# Patient Record
Sex: Male | Born: 1980
Health system: Southern US, Community
[De-identification: ages and names within clinical notes are randomized; demographics above are authoritative.]

---

## 2017-02-04 DIAGNOSIS — M7989 Other specified soft tissue disorders: Secondary | ICD-10-CM | POA: Diagnosis not present

## 2017-02-04 DIAGNOSIS — R000 Tachycardia, unspecified: Secondary | ICD-10-CM | POA: Diagnosis not present

## 2017-02-04 DIAGNOSIS — R509 Fever, unspecified: Secondary | ICD-10-CM | POA: Diagnosis not present

## 2017-02-04 DIAGNOSIS — E669 Obesity, unspecified: Secondary | ICD-10-CM | POA: Diagnosis not present

## 2017-02-04 DIAGNOSIS — L03116 Cellulitis of left lower limb: Secondary | ICD-10-CM | POA: Diagnosis not present

## 2018-02-23 DIAGNOSIS — S6991XA Unspecified injury of right wrist, hand and finger(s), initial encounter: Secondary | ICD-10-CM | POA: Diagnosis not present

## 2018-02-23 DIAGNOSIS — S61212A Laceration without foreign body of right middle finger without damage to nail, initial encounter: Secondary | ICD-10-CM | POA: Diagnosis not present

## 2018-02-23 DIAGNOSIS — S61206A Unspecified open wound of right little finger without damage to nail, initial encounter: Secondary | ICD-10-CM | POA: Diagnosis not present

## 2018-02-23 DIAGNOSIS — R030 Elevated blood-pressure reading, without diagnosis of hypertension: Secondary | ICD-10-CM | POA: Diagnosis not present

## 2018-03-13 ENCOUNTER — Encounter: Payer: Self-pay | Admitting: Family Medicine

## 2018-03-13 ENCOUNTER — Ambulatory Visit (INDEPENDENT_AMBULATORY_CARE_PROVIDER_SITE_OTHER): Payer: BLUE CROSS/BLUE SHIELD | Admitting: Family Medicine

## 2018-03-13 VITALS — BP 162/98 | HR 69 | Temp 98.2°F | Ht 75.0 in | Wt >= 6400 oz

## 2018-03-13 DIAGNOSIS — Z23 Encounter for immunization: Secondary | ICD-10-CM

## 2018-03-13 DIAGNOSIS — I10 Essential (primary) hypertension: Secondary | ICD-10-CM | POA: Insufficient documentation

## 2018-03-13 DIAGNOSIS — Z13220 Encounter for screening for lipoid disorders: Secondary | ICD-10-CM

## 2018-03-13 LAB — CBC
HCT: 50.4 % (ref 39.0–52.0)
Hemoglobin: 16.9 g/dL (ref 13.0–17.0)
MCHC: 33.6 g/dL (ref 30.0–36.0)
MCV: 90.1 fl (ref 78.0–100.0)
PLATELETS: 272 10*3/uL (ref 150.0–400.0)
RBC: 5.59 Mil/uL (ref 4.22–5.81)
RDW: 14.4 % (ref 11.5–15.5)
WBC: 7.4 10*3/uL (ref 4.0–10.5)

## 2018-03-13 LAB — LIPID PANEL
Cholesterol: 240 mg/dL — ABNORMAL HIGH (ref 0–200)
HDL: 48.1 mg/dL (ref >39.00)
LDL Cholesterol: 164 mg/dL — ABNORMAL HIGH (ref 0–99)
NonHDL: 191.48
Total CHOL/HDL Ratio: 5
Triglycerides: 139 mg/dL (ref 0.0–149.0)
VLDL: 27.8 mg/dL (ref 0.0–40.0)

## 2018-03-13 LAB — TSH: TSH: 2.8 u[IU]/mL (ref 0.35–4.50)

## 2018-03-13 LAB — COMPREHENSIVE METABOLIC PANEL
ALT: 59 U/L — ABNORMAL HIGH (ref 0–53)
AST: 39 U/L — ABNORMAL HIGH (ref 0–37)
Albumin: 4.4 g/dL (ref 3.5–5.2)
Alkaline Phosphatase: 87 U/L (ref 39–117)
BUN: 12 mg/dL (ref 6–23)
CO2: 25 mEq/L (ref 19–32)
Calcium: 9.6 mg/dL (ref 8.4–10.5)
Chloride: 102 mEq/L (ref 96–112)
Creatinine, Ser: 0.87 mg/dL (ref 0.40–1.50)
GFR: 105.67 mL/min (ref >60.00)
Glucose, Bld: 98 mg/dL (ref 70–99)
Potassium: 3.8 mEq/L (ref 3.5–5.1)
Sodium: 140 mEq/L (ref 135–145)
Total Bilirubin: 0.7 mg/dL (ref 0.2–1.2)
Total Protein: 7.9 g/dL (ref 6.0–8.3)

## 2018-03-13 MED ORDER — OLMESARTAN MEDOXOMIL-HCTZ 20-12.5 MG PO TABS: 1.0000 | ORAL_TABLET | Freq: Every day | ORAL | 1 refills | Status: DC

## 2018-03-13 NOTE — Progress Notes (Signed)
Bob Delacruz - 38 y.o. male MRN [id number]  Date of birth: [birth year]  Subjective Chief Complaint  Patient presents with  . Hypertension    concerned about his BP-has been elevated the past two weeks    HPI Bob Delacruz is a 38 y.o. male here today to establish care and discuss blood pressure.  He reports he had a laceration to his finger and found to have elevated blood pressure when he went to have this repaired.  On f/u to have sutures removed his BP was still elevated and they suggested he see a PCP to discuss this.  He denies any symptoms related to HTN including chest pain, shortness of breath, palpitations, headache or vision changes.   He admits that diet and activity could be better.  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    ROS:  A comprehensive ROS was completed and negative except as noted per HPI  No Known Allergies  History reviewed. No pertinent past medical history.  History reviewed. No pertinent surgical history.  Social History   Socioeconomic History  . Marital status: Single    Spouse name: Not on file  . Number of children: Not on file  . Years of education: Not on file  . Highest education level: Not on file  Occupational History  . Not on file  Social Needs  . Financial resource strain: Not on file  . Food insecurity:    Worry: Not on file    Inability: Not on file  . Transportation needs:    Medical: Not on file    Non-medical: Not on file  Tobacco Use  . Smoking status: Never Smoker  . Smokeless tobacco: Never Used  Substance and Sexual Activity  . Alcohol use: Yes  . Drug use: Never  . Sexual activity: Yes  Lifestyle  . Physical activity:    Days per week: Not on file    Minutes per session: Not on file  . Stress: Not on file  Relationships  . Social connections:    Talks on phone: Not on file    Gets together: Not on file    Attends religious service: Not on file    Active member  of club or organization: Not on file    Attends meetings of clubs or organizations: Not on file    Relationship status: Not on file  Other Topics Concern  . Not on file  Social History Narrative  . Not on file    Family History  Problem Relation Age of Onset  . Hypertension Father   . Stroke Father   . Diabetes Maternal Grandmother     Health Maintenance  Topic Date Due  . HIV Screening  06/08/1997  . TETANUS/TDAP  06/08/2001  . INFLUENZA VACCINE  05/29/2018 (Originally 09/28/2017)    ----------------------------------------------------------------------------------------------------------------------------------------------------------------------------------------------------------------- Physical Exam BP (!) 162/98   Pulse 69   Temp 98.2 F (36.8 C) (Oral)   Ht '6\' 3"'$  (1.905 m)   Wt (!) 404 lb (183.3 kg)   SpO2 98%   BMI 50.50 kg/m   Physical Exam Constitutional:      Appearance: He is obese. He is not ill-appearing.  HENT:     Head: Normocephalic and atraumatic.     Mouth/Throat:     Mouth: Mucous membranes are moist.  Eyes:     General: No scleral icterus. Neck:     Musculoskeletal: Normal range of motion.  Cardiovascular:     Rate  and Rhythm: Normal rate and regular rhythm.  Pulmonary:     Effort: Pulmonary effort is normal.     Breath sounds: Normal breath sounds.  Musculoskeletal:     Right lower leg: No edema.     Left lower leg: No edema.  Skin:    General: Skin is warm and dry.     Findings: No rash.  Neurological:     General: No focal deficit present.     Mental Status: He is alert.  Psychiatric:        Mood and Affect: Mood normal.        Behavior: Behavior normal.     ------------------------------------------------------------------------------------------------------------------------------------------------------------------------------------------------------------------- Assessment and Plan  Essential hypertension -BP elevated  here today -Start benicar-hct -Discussed low salt diet.  Labs per orders: Orders Placed This Encounter  Procedures  . Flu Vaccine QUAD 6+ mos PF IM (Fluarix Quad PF)  . Comp Met (CMET)  . CBC  . Lipid Profile  . TSH  Return in about 2 weeks (around 03/27/2018) for HTN.

## 2018-03-13 NOTE — Patient Instructions (Signed)

## 2018-03-13 NOTE — Assessment & Plan Note (Signed)
-  BP elevated here today -Start benicar-hct -Discussed low salt diet.  Labs per orders: Orders Placed This Encounter  Procedures  . Flu Vaccine QUAD 6+ mos PF IM (Fluarix Quad PF)  . Comp Met (CMET)  . CBC  . Lipid Profile  . TSH  Return in about 2 weeks (around 03/27/2018) for HTN.

## 2018-03-15 NOTE — Progress Notes (Signed)
-  Liver enzymes are a little elevated.  Recommend limiting any alcohol intake.  We'll recheck this again at his follow up visit.  -Cholesterol is elevated as well.  We'll talk more about management of this at his follow up on 1/28

## 2018-03-27 ENCOUNTER — Ambulatory Visit: Payer: BLUE CROSS/BLUE SHIELD | Admitting: Family Medicine

## 2018-03-29 ENCOUNTER — Ambulatory Visit (INDEPENDENT_AMBULATORY_CARE_PROVIDER_SITE_OTHER): Payer: BLUE CROSS/BLUE SHIELD | Admitting: Family Medicine

## 2018-03-29 ENCOUNTER — Encounter: Payer: Self-pay | Admitting: Family Medicine

## 2018-03-29 VITALS — BP 150/82 | HR 63 | Temp 98.5°F | Ht 75.0 in | Wt >= 6400 oz

## 2018-03-29 DIAGNOSIS — I10 Essential (primary) hypertension: Secondary | ICD-10-CM | POA: Diagnosis not present

## 2018-03-29 DIAGNOSIS — E785 Hyperlipidemia, unspecified: Secondary | ICD-10-CM | POA: Diagnosis not present

## 2018-03-29 DIAGNOSIS — R748 Abnormal levels of other serum enzymes: Secondary | ICD-10-CM

## 2018-03-29 LAB — HEPATIC FUNCTION PANEL
ALBUMIN: 4.3 g/dL (ref 3.5–5.2)
ALK PHOS: 74 U/L (ref 39–117)
ALT: 55 U/L — ABNORMAL HIGH (ref 0–53)
AST: 28 U/L (ref 0–37)
Bilirubin, Direct: 0.2 mg/dL (ref 0.0–0.3)
Total Bilirubin: 0.9 mg/dL (ref 0.2–1.2)
Total Protein: 7.6 g/dL (ref 6.0–8.3)

## 2018-03-29 MED ORDER — OLMESARTAN MEDOXOMIL-HCTZ 40-25 MG PO TABS: 1.0000 | ORAL_TABLET | Freq: Every day | ORAL | 1 refills | Status: AC

## 2018-03-29 MED ORDER — ATORVASTATIN CALCIUM 20 MG PO TABS: 20.0000 mg | ORAL_TABLET | Freq: Every day | ORAL | 2 refills | Status: AC

## 2018-03-29 NOTE — Assessment & Plan Note (Signed)
-  BP improved from last check but still not at goal. -Increase benicar-hct to 40/25mg  -Return to recheck in 8 weeks.

## 2018-03-29 NOTE — Assessment & Plan Note (Signed)
-  Discussed starting medication for management of cholesterol which he is willing to try.  -Start atorvastatin 20mg .  -Recommend that he continue to work on lifestyle changes.

## 2018-03-29 NOTE — Assessment & Plan Note (Signed)
Update LFTs today.  

## 2018-03-29 NOTE — Patient Instructions (Signed)
-  Increase olmesartan-HCTZ to 40mg /25mg  each day.  (you can take 2 of your current dose and I have sent in a new prescription, you will only take 1 of these) -Start atorvastatin for cholesterol -See me again in 8 weeks.

## 2018-03-29 NOTE — Progress Notes (Signed)
Bob Delacruz - 38 y.o. male MRN [id number]  Date of birth: 1980/09/24  Subjective Chief Complaint  Patient presents with  . Follow-up    denies changes in his health    HPI Bob Delacruz is a 38 y.o. male here today for follow up of HTN.  Started on benicar at recent appt.  HE reports taking daily and tolerating well.  He denies side effects including symptoms of hypotension.  He is working on making dietary changes including eliminating fried products.  He denies chest pain, shortness of breath, palpitations, headache or vision changes.    His cholesterol and LFT's were elevated on recent lab testing.  He would be interested in starting medication to help with mgmt of his cholesterol.  He denies significant EtOH use.    ROS:  A comprehensive ROS was completed and negative except as noted per HPI  No Known Allergies  No past medical history on file.  No past surgical history on file.  Social History   Socioeconomic History  . Marital status: Single    Spouse name: Not on file  . Number of children: Not on file  . Years of education: Not on file  . Highest education level: Not on file  Occupational History  . Not on file  Social Needs  . Financial resource strain: Not on file  . Food insecurity:    Worry: Not on file    Inability: Not on file  . Transportation needs:    Medical: Not on file    Non-medical: Not on file  Tobacco Use  . Smoking status: Never Smoker  . Smokeless tobacco: Never Used  Substance and Sexual Activity  . Alcohol use: Yes  . Drug use: Never  . Sexual activity: Yes  Lifestyle  . Physical activity:    Days per week: Not on file    Minutes per session: Not on file  . Stress: Not on file  Relationships  . Social connections:    Talks on phone: Not on file    Gets together: Not on file    Attends religious service: Not on file    Active member of club or organization: Not on file    Attends meetings of clubs or organizations: Not on file   Relationship status: Not on file  Other Topics Concern  . Not on file  Social History Narrative  . Not on file    Family History  Problem Relation Age of Onset  . Hypertension Father   . Stroke Father   . Diabetes Maternal Grandmother     Health Maintenance  Topic Date Due  . HIV Screening  06/08/1997  . TETANUS/TDAP  06/22/2027  . INFLUENZA VACCINE  Completed    ----------------------------------------------------------------------------------------------------------------------------------------------------------------------------------------------------------------- Physical Exam BP (!) 150/82   Pulse 63   Temp 98.5 F (36.9 C) (Oral)   Ht 6\' 3"  (1.905 m)   Wt (!) 401 lb (181.9 kg)   SpO2 99%   BMI 50.12 kg/m   Physical Exam Constitutional:      Appearance: He is obese.  HENT:     Head: Normocephalic and atraumatic.  Eyes:     General: No scleral icterus. Neck:     Musculoskeletal: Neck supple.  Cardiovascular:     Rate and Rhythm: Normal rate and regular rhythm.  Pulmonary:     Effort: Pulmonary effort is normal.     Breath sounds: Normal breath sounds.  Skin:    General: Skin is warm and dry.  Findings: No rash.  Neurological:     General: No focal deficit present.     Mental Status: He is alert and oriented to person, place, and time.  Psychiatric:        Mood and Affect: Mood normal.        Behavior: Behavior normal.     ------------------------------------------------------------------------------------------------------------------------------------------------------------------------------------------------------------------- Assessment and Plan  HLD (hyperlipidemia) -Discussed starting medication for management of cholesterol which he is willing to try.  -Start atorvastatin 20mg .  -Recommend that he continue to work on lifestyle changes.   Essential hypertension -BP improved from last check but still not at goal. -Increase benicar-hct  to 40/25mg  -Return to recheck in 8 weeks.   Elevated liver enzymes -Update LFT's today

## 2018-04-03 NOTE — Progress Notes (Signed)
Liver enzymes improved some.  We'll continue to keep an eye on this with routine lab work.

## 2018-05-24 ENCOUNTER — Encounter: Payer: Self-pay | Admitting: Family Medicine

## 2018-05-24 ENCOUNTER — Ambulatory Visit (INDEPENDENT_AMBULATORY_CARE_PROVIDER_SITE_OTHER): Payer: BLUE CROSS/BLUE SHIELD | Admitting: Family Medicine

## 2018-05-24 DIAGNOSIS — E785 Hyperlipidemia, unspecified: Secondary | ICD-10-CM

## 2018-05-24 DIAGNOSIS — I10 Essential (primary) hypertension: Secondary | ICD-10-CM

## 2018-05-24 NOTE — Progress Notes (Addendum)
Virtual Visit via Telephone Note  I connected with Bob Delacruz on 05/24/18 at  8:30 AM EDT by telephone and verified that I am speaking with the correct person using two identifiers.  Interactive audio and video telecommunications were attempted between this provider and patient, however failed, due to patient having technical difficulties OR patient did not have access to video capability.  We continued and completed visit with audio only.     I discussed the limitations, risks, security and privacy concerns of performing an evaluation and management service by telephone and the availability of in person appointments. I also discussed with the patient that there may be a patient responsible charge related to this service. The patient expressed understanding and agreed to proceed.  Location patient: home Location provider: work or home office Participants present for the call: patient, provider    History of Present Illness: Bob Delacruz is a 38 y.o. male with history of HTN and HLD following up for these chronic issues.   -HTN:  He reports that he is doing well.  BP improving, readings ranging from 135-155/78-88.  He denies side effects from medication.  He feels well.  He denies chest pain, shortness of breath, palpitations. headache or vision change.s   -HLD:  Started on atorvastatin at previous visit.  He is doing well with medication without symptoms of myalgias or GI upset.  .  ROS:  A comprehensive ROS was completed and negative except as noted per HPI    Observations/Objective: Patient sounds cheerful and well on the phone. I do not appreciate any SOB. Speech and thought processing are grossly intact. Patient reported vitals: BP (!) 154/86 Comment: Pt reported from Home [employer] machine over the phone   Assessment and Plan: Essential hypertension -BP improving, he will continue current medications -Stress following low salt diet and recommend weight loss -face to face visit  planned for 3 months.   HLD (hyperlipidemia) -tolerating statin well, he will continue.  Recheck lipids at f/u visit .     Follow Up Instructions:  He will continue current medication and f/u in 3 months for a face to face visit.    I discussed the assessment and treatment plan with the patient. The patient was provided an opportunity to ask questions and all were answered. The patient agreed with the plan and demonstrated an understanding of the instructions.   The patient was advised to call back or seek an in-person evaluation if the symptoms worsen or if the condition fails to improve as anticipated.  I provided 15  minutes of non-face-to-face time during this encounter.   Everrett Coombe, DO

## 2018-05-24 NOTE — Assessment & Plan Note (Signed)
-  tolerating statin well, he will continue.  Recheck lipids at f/u visit .

## 2018-05-24 NOTE — Assessment & Plan Note (Signed)
-  BP improving, he will continue current medications -Stress following low salt diet and recommend weight loss -face to face visit planned for 3 months.

## 2018-08-15 ENCOUNTER — Ambulatory Visit: Payer: BLUE CROSS/BLUE SHIELD | Admitting: Family Medicine

## 2018-08-17 ENCOUNTER — Telehealth: Payer: Self-pay

## 2018-08-17 NOTE — Telephone Encounter (Signed)
Questions for Screening COVID-19  Symptom onset: none Travel or Contacts: none  During this illness, did/does the patient experience any of the following symptoms? Fever >100.1F []   Yes [x]   No []   Unknown Subjective fever (felt feverish) []   Yes [x]   No []   Unknown Chills []   Yes [x]   No []   Unknown Muscle aches (myalgia) []   Yes [x]   No []   Unknown Runny nose (rhinorrhea) []   Yes [x]   No []   Unknown Sore throat []   Yes [x]   No []   Unknown Cough (new onset or worsening of chronic cough) []   Yes [x]   No []   Unknown Shortness of breath (dyspnea) []   Yes [x]   No []   Unknown Nausea or vomiting []   Yes [x]   No []   Unknown Headache []   Yes [x]   No []   Unknown Abdominal pain  []   Yes [x]   No []   Unknown Diarrhea (?3 loose/looser than normal stools/24hr period) []   Yes [x]   No []   Unknown Other, specify:  Patient risk factors: Smoker? [x]   Current []   Former []   Never If male, currently pregnant? []   Yes [x]   No  Patient Active Problem List   Diagnosis Date Noted  . Elevated liver enzymes 03/29/2018  . HLD (hyperlipidemia) 03/29/2018  . Essential hypertension 03/13/2018    Plan:  []   High risk for COVID-19 with red flags go to ED (with CP, SOB, weak/lightheaded, or fever > 101.5). Call ahead.  []   High risk for COVID-19 but stable. Inform provider and coordinate time for [facility] visit.   [x]   No red flags but URI signs or symptoms okay for [facility] visit.

## 2018-08-20 ENCOUNTER — Ambulatory Visit: Payer: BC Managed Care – PPO | Admitting: Family Medicine

## 2018-12-03 ENCOUNTER — Ambulatory Visit (INDEPENDENT_AMBULATORY_CARE_PROVIDER_SITE_OTHER): Payer: BC Managed Care – PPO

## 2018-12-03 ENCOUNTER — Other Ambulatory Visit: Payer: Self-pay | Admitting: Family Medicine

## 2018-12-03 ENCOUNTER — Ambulatory Visit (INDEPENDENT_AMBULATORY_CARE_PROVIDER_SITE_OTHER): Payer: BC Managed Care – PPO | Admitting: Family Medicine

## 2018-12-03 ENCOUNTER — Encounter: Payer: Self-pay | Admitting: Family Medicine

## 2018-12-03 ENCOUNTER — Other Ambulatory Visit: Payer: Self-pay

## 2018-12-03 VITALS — BP 132/82 | HR 80 | Temp 98.6°F | Resp 18 | Ht 75.0 in | Wt >= 6400 oz

## 2018-12-03 DIAGNOSIS — E785 Hyperlipidemia, unspecified: Secondary | ICD-10-CM

## 2018-12-03 DIAGNOSIS — Z23 Encounter for immunization: Secondary | ICD-10-CM

## 2018-12-03 DIAGNOSIS — I10 Essential (primary) hypertension: Secondary | ICD-10-CM

## 2018-12-03 DIAGNOSIS — S32009A Unspecified fracture of unspecified lumbar vertebra, initial encounter for closed fracture: Secondary | ICD-10-CM

## 2018-12-03 DIAGNOSIS — M545 Low back pain, unspecified: Secondary | ICD-10-CM

## 2018-12-03 LAB — LIPID PANEL
Cholesterol: 229 mg/dL — ABNORMAL HIGH (ref 0–200)
HDL: 42.7 mg/dL (ref >39.00)
LDL Cholesterol: 157 mg/dL — ABNORMAL HIGH (ref 0–99)
NonHDL: 185.86
Total CHOL/HDL Ratio: 5
Triglycerides: 144 mg/dL (ref 0.0–149.0)
VLDL: 28.8 mg/dL (ref 0.0–40.0)

## 2018-12-03 LAB — COMPREHENSIVE METABOLIC PANEL
ALT: 47 U/L (ref 0–53)
AST: 28 U/L (ref 0–37)
Albumin: 4.3 g/dL (ref 3.5–5.2)
Alkaline Phosphatase: 80 U/L (ref 39–117)
BUN: 14 mg/dL (ref 6–23)
CO2: 29 mEq/L (ref 19–32)
Calcium: 9.9 mg/dL (ref 8.4–10.5)
Chloride: 99 mEq/L (ref 96–112)
Creatinine, Ser: 0.94 mg/dL (ref 0.40–1.50)
GFR: 90.56 mL/min (ref >60.00)
Glucose, Bld: 170 mg/dL — ABNORMAL HIGH (ref 70–99)
Potassium: 4.2 mEq/L (ref 3.5–5.1)
Sodium: 137 mEq/L (ref 135–145)
Total Bilirubin: 0.7 mg/dL (ref 0.2–1.2)
Total Protein: 7.8 g/dL (ref 6.0–8.3)

## 2018-12-03 NOTE — Assessment & Plan Note (Signed)
BP is well controlled at this time, continue current medication -Instructed to continue to work on weight loss and sodium reduction.  -F/u 6 months.

## 2018-12-03 NOTE — Patient Instructions (Signed)
Blood pressure is looking good today! Continue current medications We'll be in touch with xray and lab results.

## 2018-12-03 NOTE — Progress Notes (Signed)
Bob Delacruz - 38 y.o. male MRN [id number]  Date of birth: 1980/12/01  Subjective Chief Complaint  Patient presents with  . Follow-up  . Flu Vaccine    today     HPI Bob Delacruz is a 38 y.o. male here today for follow up of HTN and HLD.  He also has complaint of low back pain.   -HTN:  Doing well with benicar-hct.  BP has been much better controlled since starting this.  He denies side effects or symptoms of hypotension.  He has cut back on salt intake.    -HLD:  Doing well with atorvastatin.  He denies myalgias or abdominal pain/nausea.   -Low back pain:  Low back pain on L side for about 3 months.  He works on a [employer] and reports that he fell off a bridge a few months ago.  The harness he was wearing caught him but he did swing into a metal beam hitting his back.  He gets some intermittent numbness down the side of his leg but denies weakness.  He has not tried anything for treatment other than occasional ibuprofen.   ROS:  A comprehensive ROS was completed and negative except as noted per HPI  No Known Allergies  No past medical history on file.  No past surgical history on file.  Social History   Socioeconomic History  . Marital status: Single    Spouse name: Not on file  . Number of children: Not on file  . Years of education: Not on file  . Highest education level: Not on file  Occupational History  . Not on file  Social Needs  . Financial resource strain: Not on file  . Food insecurity    Worry: Not on file    Inability: Not on file  . Transportation needs    Medical: Not on file    Non-medical: Not on file  Tobacco Use  . Smoking status: Never Smoker  . Smokeless tobacco: Never Used  Substance and Sexual Activity  . Alcohol use: Yes  . Drug use: Never  . Sexual activity: Yes  Lifestyle  . Physical activity    Days per week: Not on file    Minutes per session: Not on file  . Stress: Not on file  Relationships  . Social Product manager on phone: Not on file    Gets together: Not on file    Attends religious service: Not on file    Active member of club or organization: Not on file    Attends meetings of clubs or organizations: Not on file    Relationship status: Not on file  Other Topics Concern  . Not on file  Social History Narrative  . Not on file    Family History  Problem Relation Age of Onset  . Hypertension Father   . Stroke Father   . Diabetes Maternal Grandmother     Health Maintenance  Topic Date Due  . HIV Screening  06/08/1997  . INFLUENZA VACCINE  09/29/2018  . TETANUS/TDAP  06/22/2027    ----------------------------------------------------------------------------------------------------------------------------------------------------------------------------------------------------------------- Physical Exam BP 132/82 (BP Location: Left Arm)   Pulse 80   Temp 98.6 F (37 C)   Resp 18   Ht 6\' 3"  (1.905 m)   Wt (!) 407 lb 6.4 oz (184.8 kg)   SpO2 98%   BMI 50.92 kg/m   Physical Exam Constitutional:      Appearance: Normal appearance.  HENT:  Head: Normocephalic and atraumatic.     Mouth/Throat:     Mouth: Mucous membranes are moist.  Eyes:     General: No scleral icterus. Neck:     Musculoskeletal: Neck supple.  Cardiovascular:     Rate and Rhythm: Normal rate and regular rhythm.  Pulmonary:     Effort: Pulmonary effort is normal.     Breath sounds: Normal breath sounds.  Musculoskeletal:     Comments: Mild TTP along L paraspinal area.  No spasm noted.   Skin:    General: Skin is warm and dry.  Neurological:     General: No focal deficit present.     Mental Status: He is alert.  Psychiatric:        Mood and Affect: Mood normal.        Behavior: Behavior normal.      ------------------------------------------------------------------------------------------------------------------------------------------------------------------------------------------------------------------- Assessment and Plan  Essential hypertension BP is well controlled at this time, continue current medication -Instructed to continue to work on weight loss and sodium reduction.  -F/u 6 months.   HLD (hyperlipidemia) -Doing well with atorvastatin, continue -Update LFT's and lipid panel    Acute left-sided low back pain without sciatica -Xrays ordered, if negative discussed referral to PT.

## 2018-12-03 NOTE — Assessment & Plan Note (Signed)
-  Doing well with atorvastatin, continue -Update LFT's and lipid panel

## 2018-12-03 NOTE — Assessment & Plan Note (Signed)
-  Xrays ordered, if negative discussed referral to PT.

## 2018-12-12 ENCOUNTER — Ambulatory Visit
Admission: RE | Admit: 2018-12-12 | Discharge: 2018-12-12 | Disposition: A | Discharge status: Home / Self Care | Payer: BC Managed Care – PPO | Source: Ambulatory Visit | Attending: Family Medicine | Admitting: Family Medicine

## 2018-12-12 ENCOUNTER — Other Ambulatory Visit: Payer: Self-pay | Admitting: Family Medicine

## 2018-12-12 ENCOUNTER — Other Ambulatory Visit: Payer: Self-pay

## 2018-12-12 DIAGNOSIS — M545 Low back pain: Secondary | ICD-10-CM | POA: Diagnosis not present

## 2018-12-12 DIAGNOSIS — S32038A Other fracture of third lumbar vertebra, initial encounter for closed fracture: Secondary | ICD-10-CM

## 2018-12-12 DIAGNOSIS — S32009A Unspecified fracture of unspecified lumbar vertebra, initial encounter for closed fracture: Secondary | ICD-10-CM

## 2018-12-20 DIAGNOSIS — S32038A Other fracture of third lumbar vertebra, initial encounter for closed fracture: Secondary | ICD-10-CM | POA: Diagnosis not present

## 2018-12-21 ENCOUNTER — Other Ambulatory Visit: Payer: Self-pay | Admitting: Family Medicine

## 2018-12-21 DIAGNOSIS — M545 Low back pain, unspecified: Secondary | ICD-10-CM

## 2018-12-21 DIAGNOSIS — S32000A Wedge compression fracture of unspecified lumbar vertebra, initial encounter for closed fracture: Secondary | ICD-10-CM

## 2019-06-03 ENCOUNTER — Ambulatory Visit: Payer: BC Managed Care – PPO | Admitting: Family Medicine

## 2020-03-13 DIAGNOSIS — E785 Hyperlipidemia, unspecified: Secondary | ICD-10-CM | POA: Diagnosis not present

## 2020-03-13 DIAGNOSIS — I10 Essential (primary) hypertension: Secondary | ICD-10-CM | POA: Diagnosis not present

## 2020-07-08 DIAGNOSIS — M5416 Radiculopathy, lumbar region: Secondary | ICD-10-CM | POA: Diagnosis not present

## 2020-07-08 DIAGNOSIS — S335XXA Sprain of ligaments of lumbar spine, initial encounter: Secondary | ICD-10-CM | POA: Diagnosis not present

## 2020-07-08 DIAGNOSIS — M62830 Muscle spasm of back: Secondary | ICD-10-CM | POA: Diagnosis not present

## 2020-07-09 DIAGNOSIS — S335XXA Sprain of ligaments of lumbar spine, initial encounter: Secondary | ICD-10-CM | POA: Diagnosis not present

## 2020-07-09 DIAGNOSIS — M5416 Radiculopathy, lumbar region: Secondary | ICD-10-CM | POA: Diagnosis not present

## 2020-07-09 DIAGNOSIS — M62830 Muscle spasm of back: Secondary | ICD-10-CM | POA: Diagnosis not present

## 2020-07-14 DIAGNOSIS — M5416 Radiculopathy, lumbar region: Secondary | ICD-10-CM | POA: Diagnosis not present

## 2020-07-14 DIAGNOSIS — S335XXA Sprain of ligaments of lumbar spine, initial encounter: Secondary | ICD-10-CM | POA: Diagnosis not present

## 2020-07-14 DIAGNOSIS — M62830 Muscle spasm of back: Secondary | ICD-10-CM | POA: Diagnosis not present

## 2020-07-23 DIAGNOSIS — M62830 Muscle spasm of back: Secondary | ICD-10-CM | POA: Diagnosis not present

## 2020-07-23 DIAGNOSIS — S335XXA Sprain of ligaments of lumbar spine, initial encounter: Secondary | ICD-10-CM | POA: Diagnosis not present

## 2020-07-23 DIAGNOSIS — M5416 Radiculopathy, lumbar region: Secondary | ICD-10-CM | POA: Diagnosis not present

## 2020-09-07 DIAGNOSIS — M9903 Segmental and somatic dysfunction of lumbar region: Secondary | ICD-10-CM | POA: Diagnosis not present

## 2020-09-07 DIAGNOSIS — M9905 Segmental and somatic dysfunction of pelvic region: Secondary | ICD-10-CM | POA: Diagnosis not present

## 2020-09-07 DIAGNOSIS — S335XXA Sprain of ligaments of lumbar spine, initial encounter: Secondary | ICD-10-CM | POA: Diagnosis not present

## 2020-09-07 DIAGNOSIS — M9904 Segmental and somatic dysfunction of sacral region: Secondary | ICD-10-CM | POA: Diagnosis not present

## 2020-09-08 DIAGNOSIS — M9903 Segmental and somatic dysfunction of lumbar region: Secondary | ICD-10-CM | POA: Diagnosis not present

## 2020-09-08 DIAGNOSIS — M9905 Segmental and somatic dysfunction of pelvic region: Secondary | ICD-10-CM | POA: Diagnosis not present

## 2020-09-08 DIAGNOSIS — S335XXA Sprain of ligaments of lumbar spine, initial encounter: Secondary | ICD-10-CM | POA: Diagnosis not present

## 2020-09-08 DIAGNOSIS — M9904 Segmental and somatic dysfunction of sacral region: Secondary | ICD-10-CM | POA: Diagnosis not present

## 2020-09-21 DIAGNOSIS — M9903 Segmental and somatic dysfunction of lumbar region: Secondary | ICD-10-CM | POA: Diagnosis not present

## 2020-09-21 DIAGNOSIS — M9905 Segmental and somatic dysfunction of pelvic region: Secondary | ICD-10-CM | POA: Diagnosis not present

## 2020-09-21 DIAGNOSIS — M9904 Segmental and somatic dysfunction of sacral region: Secondary | ICD-10-CM | POA: Diagnosis not present

## 2020-09-21 DIAGNOSIS — S335XXA Sprain of ligaments of lumbar spine, initial encounter: Secondary | ICD-10-CM | POA: Diagnosis not present

## 2020-09-23 DIAGNOSIS — S335XXA Sprain of ligaments of lumbar spine, initial encounter: Secondary | ICD-10-CM | POA: Diagnosis not present

## 2020-09-23 DIAGNOSIS — M9905 Segmental and somatic dysfunction of pelvic region: Secondary | ICD-10-CM | POA: Diagnosis not present

## 2020-09-23 DIAGNOSIS — M9903 Segmental and somatic dysfunction of lumbar region: Secondary | ICD-10-CM | POA: Diagnosis not present

## 2020-09-23 DIAGNOSIS — M9904 Segmental and somatic dysfunction of sacral region: Secondary | ICD-10-CM | POA: Diagnosis not present

## 2020-10-04 IMAGING — DX DG LUMBAR SPINE COMPLETE 4+V
5 series · 5 of 5 positions shown · non-contrast
Comparison: None.
COMPARISON: None.

Addendum:
CLINICAL DATA: Fall from bridge, low back pain for 3 months.

EXAM:
LUMBAR SPINE - COMPLETE 4+ VIEW

[lumbar spine ap]
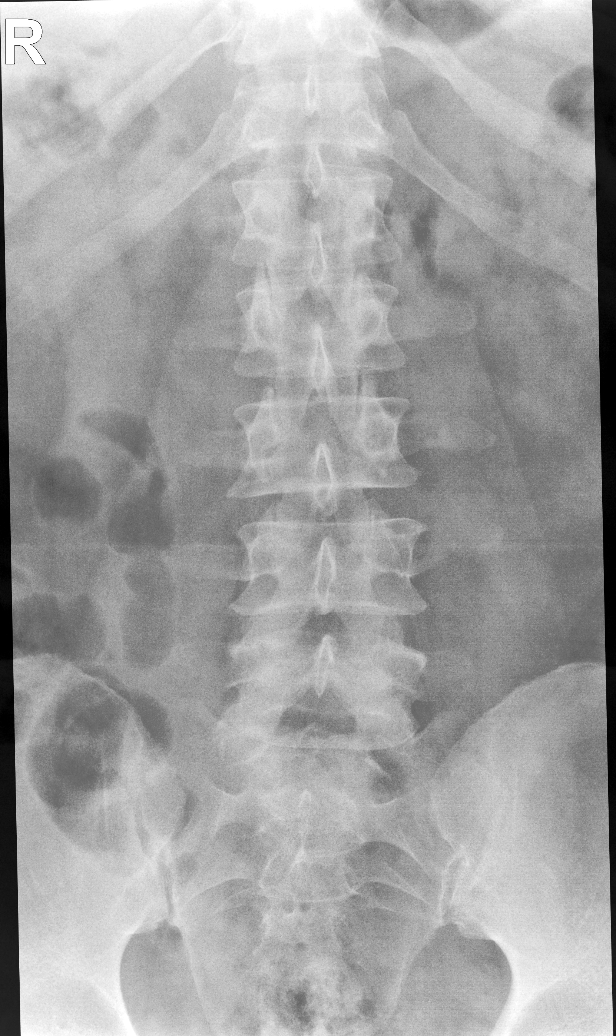

[lumbar spine lmo (1 of 2)]
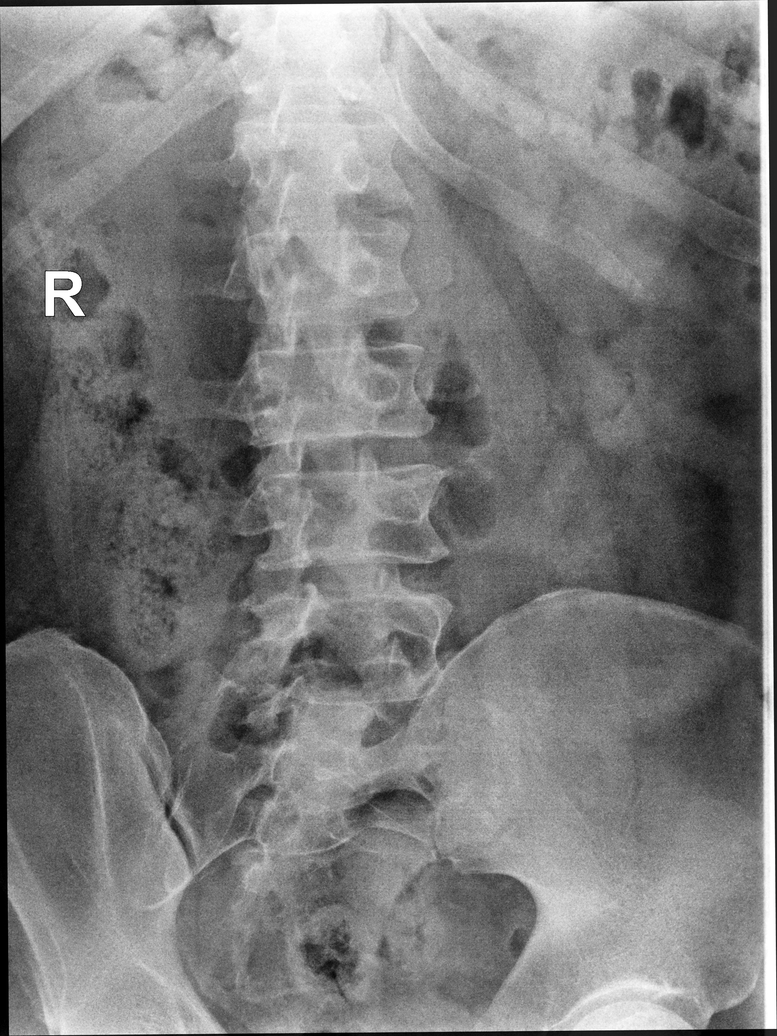

[lumbar spine lmo (2 of 2)]
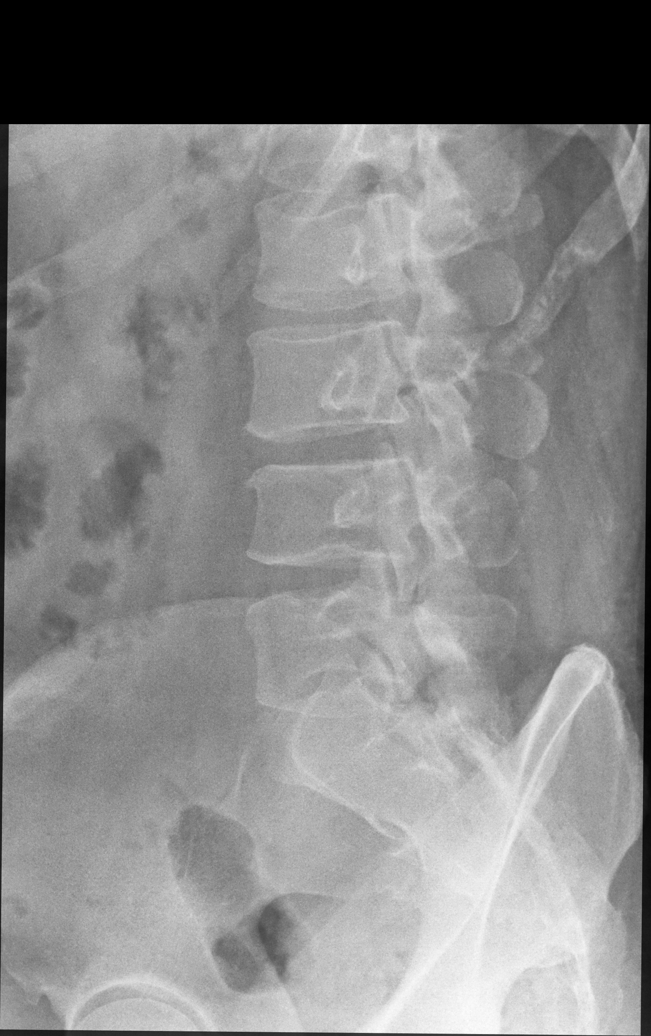

[lumbar spine lat (1 of 2)]
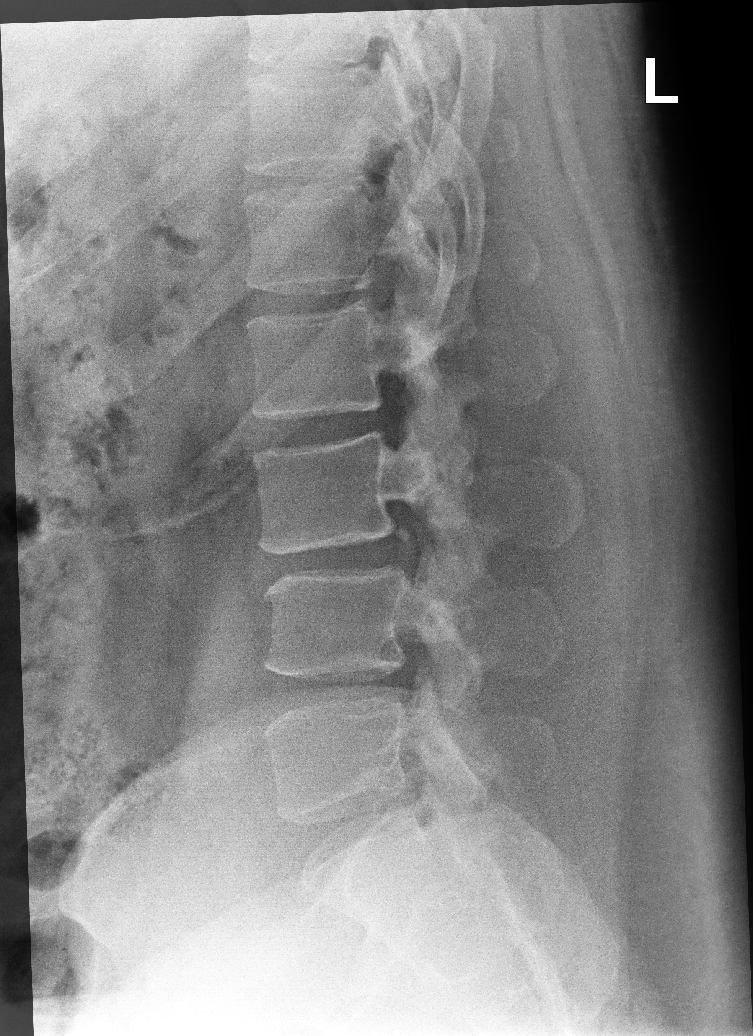

[lumbar spine lat (2 of 2)]
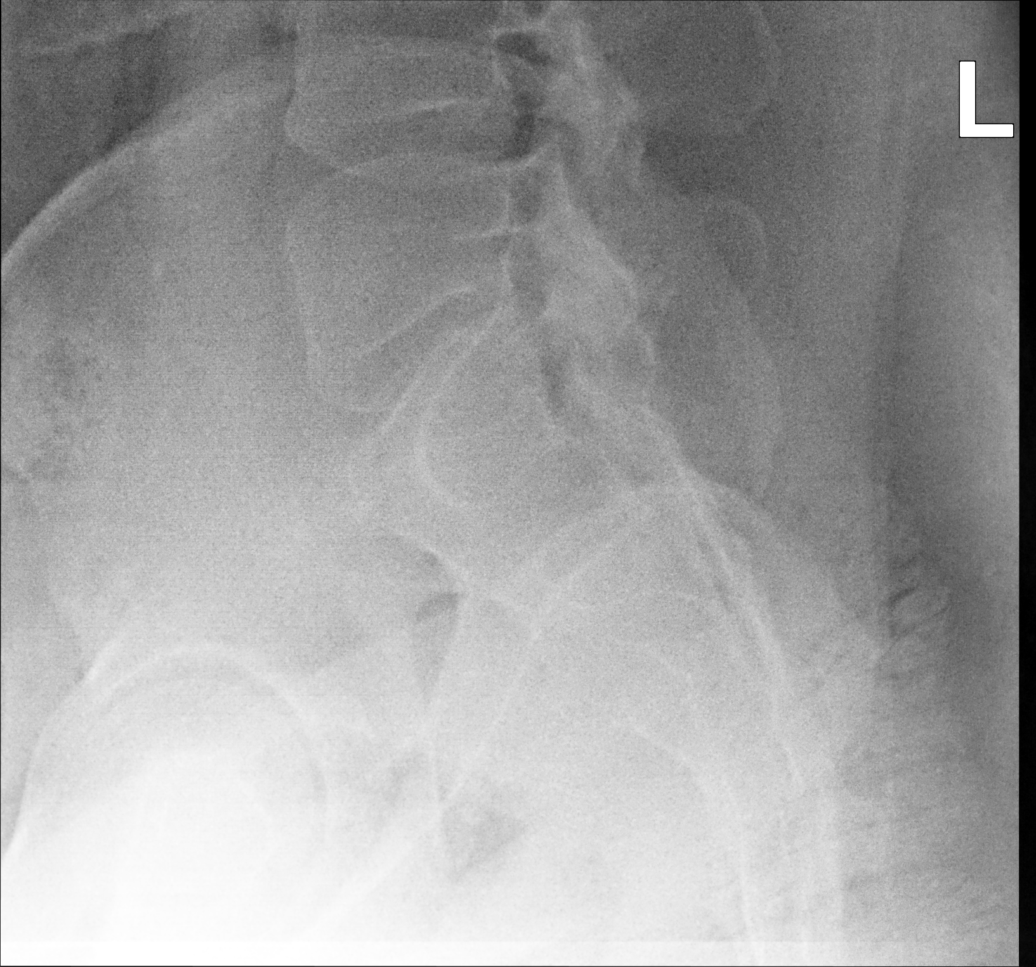

[5 of 5 positions shown; findings below may reference images not displayed]

FINDINGS: Very mild anterolisthesis of L3 on 4 is demonstrated with some
lucency of posterior elements, perhaps along the pars or within
facet elements. There is no significant degenerative change in the
posterior elements in the spine. Mild degenerative changes are seen
elsewhere. Suggestion of disc bulge posteriorly at the L3-L4 level.
Remainder of the lumbar spine is unremarkable.
IMPRESSION: Potential fracture of posterior elements at the L3 level with disc
bulging and mild kyphosis. Consider CT or MRI for further
assessment. At this time a call is out to the referring provider to
discuss these findings.

ADDENDUM:
These results were called by telephone at the time of interpretation
on 12/03/2018 at [DATE] to provider ROY SHIMIZU , who verbally
acknowledged these results.

*** End of Addendum ***
FINDINGS: Very mild anterolisthesis of L3 on 4 is demonstrated with some
lucency of posterior elements, perhaps along the pars or within
facet elements. There is no significant degenerative change in the
posterior elements in the spine. Mild degenerative changes are seen
elsewhere. Suggestion of disc bulge posteriorly at the L3-L4 level.
Remainder of the lumbar spine is unremarkable.
IMPRESSION: Potential fracture of posterior elements at the L3 level with disc
bulging and mild kyphosis. Consider CT or MRI for further
assessment. At this time a call is out to the referring provider to
discuss these findings.

## 2021-01-05 DIAGNOSIS — M9903 Segmental and somatic dysfunction of lumbar region: Secondary | ICD-10-CM | POA: Diagnosis not present

## 2021-01-05 DIAGNOSIS — M9904 Segmental and somatic dysfunction of sacral region: Secondary | ICD-10-CM | POA: Diagnosis not present

## 2021-01-05 DIAGNOSIS — M47817 Spondylosis without myelopathy or radiculopathy, lumbosacral region: Secondary | ICD-10-CM | POA: Diagnosis not present

## 2021-01-05 DIAGNOSIS — S336XXA Sprain of sacroiliac joint, initial encounter: Secondary | ICD-10-CM | POA: Diagnosis not present

## 2021-01-08 DIAGNOSIS — M47817 Spondylosis without myelopathy or radiculopathy, lumbosacral region: Secondary | ICD-10-CM | POA: Diagnosis not present

## 2021-01-08 DIAGNOSIS — M9904 Segmental and somatic dysfunction of sacral region: Secondary | ICD-10-CM | POA: Diagnosis not present

## 2021-01-08 DIAGNOSIS — M7061 Trochanteric bursitis, right hip: Secondary | ICD-10-CM | POA: Diagnosis not present

## 2021-01-08 DIAGNOSIS — S336XXA Sprain of sacroiliac joint, initial encounter: Secondary | ICD-10-CM | POA: Diagnosis not present

## 2021-01-08 DIAGNOSIS — M9903 Segmental and somatic dysfunction of lumbar region: Secondary | ICD-10-CM | POA: Diagnosis not present

## 2021-01-11 DIAGNOSIS — M7061 Trochanteric bursitis, right hip: Secondary | ICD-10-CM | POA: Diagnosis not present

## 2021-01-11 DIAGNOSIS — M9903 Segmental and somatic dysfunction of lumbar region: Secondary | ICD-10-CM | POA: Diagnosis not present

## 2021-01-11 DIAGNOSIS — S336XXA Sprain of sacroiliac joint, initial encounter: Secondary | ICD-10-CM | POA: Diagnosis not present

## 2021-01-11 DIAGNOSIS — M9904 Segmental and somatic dysfunction of sacral region: Secondary | ICD-10-CM | POA: Diagnosis not present

## 2021-01-11 DIAGNOSIS — M47817 Spondylosis without myelopathy or radiculopathy, lumbosacral region: Secondary | ICD-10-CM | POA: Diagnosis not present

## 2021-01-18 DIAGNOSIS — M9903 Segmental and somatic dysfunction of lumbar region: Secondary | ICD-10-CM | POA: Diagnosis not present

## 2021-01-18 DIAGNOSIS — M7061 Trochanteric bursitis, right hip: Secondary | ICD-10-CM | POA: Diagnosis not present

## 2021-01-18 DIAGNOSIS — M9904 Segmental and somatic dysfunction of sacral region: Secondary | ICD-10-CM | POA: Diagnosis not present

## 2021-01-18 DIAGNOSIS — S336XXA Sprain of sacroiliac joint, initial encounter: Secondary | ICD-10-CM | POA: Diagnosis not present

## 2021-01-18 DIAGNOSIS — M47817 Spondylosis without myelopathy or radiculopathy, lumbosacral region: Secondary | ICD-10-CM | POA: Diagnosis not present

## 2021-01-19 DIAGNOSIS — S336XXA Sprain of sacroiliac joint, initial encounter: Secondary | ICD-10-CM | POA: Diagnosis not present

## 2021-01-19 DIAGNOSIS — M9903 Segmental and somatic dysfunction of lumbar region: Secondary | ICD-10-CM | POA: Diagnosis not present

## 2021-01-19 DIAGNOSIS — M9904 Segmental and somatic dysfunction of sacral region: Secondary | ICD-10-CM | POA: Diagnosis not present

## 2021-01-19 DIAGNOSIS — M7061 Trochanteric bursitis, right hip: Secondary | ICD-10-CM | POA: Diagnosis not present

## 2021-01-19 DIAGNOSIS — M47817 Spondylosis without myelopathy or radiculopathy, lumbosacral region: Secondary | ICD-10-CM | POA: Diagnosis not present

## 2021-01-27 DIAGNOSIS — M9904 Segmental and somatic dysfunction of sacral region: Secondary | ICD-10-CM | POA: Diagnosis not present

## 2021-01-27 DIAGNOSIS — M9903 Segmental and somatic dysfunction of lumbar region: Secondary | ICD-10-CM | POA: Diagnosis not present

## 2021-01-27 DIAGNOSIS — S336XXA Sprain of sacroiliac joint, initial encounter: Secondary | ICD-10-CM | POA: Diagnosis not present

## 2021-01-27 DIAGNOSIS — M47817 Spondylosis without myelopathy or radiculopathy, lumbosacral region: Secondary | ICD-10-CM | POA: Diagnosis not present

## 2021-02-01 DIAGNOSIS — M47817 Spondylosis without myelopathy or radiculopathy, lumbosacral region: Secondary | ICD-10-CM | POA: Diagnosis not present

## 2021-02-01 DIAGNOSIS — M9904 Segmental and somatic dysfunction of sacral region: Secondary | ICD-10-CM | POA: Diagnosis not present

## 2021-02-01 DIAGNOSIS — S336XXA Sprain of sacroiliac joint, initial encounter: Secondary | ICD-10-CM | POA: Diagnosis not present

## 2021-02-01 DIAGNOSIS — M9903 Segmental and somatic dysfunction of lumbar region: Secondary | ICD-10-CM | POA: Diagnosis not present

## 2021-02-05 DIAGNOSIS — M9903 Segmental and somatic dysfunction of lumbar region: Secondary | ICD-10-CM | POA: Diagnosis not present

## 2021-02-05 DIAGNOSIS — M47817 Spondylosis without myelopathy or radiculopathy, lumbosacral region: Secondary | ICD-10-CM | POA: Diagnosis not present

## 2021-02-05 DIAGNOSIS — S336XXA Sprain of sacroiliac joint, initial encounter: Secondary | ICD-10-CM | POA: Diagnosis not present

## 2021-02-05 DIAGNOSIS — M9904 Segmental and somatic dysfunction of sacral region: Secondary | ICD-10-CM | POA: Diagnosis not present

## 2021-02-12 DIAGNOSIS — M47817 Spondylosis without myelopathy or radiculopathy, lumbosacral region: Secondary | ICD-10-CM | POA: Diagnosis not present

## 2021-02-12 DIAGNOSIS — M9904 Segmental and somatic dysfunction of sacral region: Secondary | ICD-10-CM | POA: Diagnosis not present

## 2021-02-12 DIAGNOSIS — M9903 Segmental and somatic dysfunction of lumbar region: Secondary | ICD-10-CM | POA: Diagnosis not present

## 2021-02-12 DIAGNOSIS — S336XXA Sprain of sacroiliac joint, initial encounter: Secondary | ICD-10-CM | POA: Diagnosis not present
